# Patient Record
Sex: Male | Born: 1980
Health system: Southern US, Community
[De-identification: ages and names within clinical notes are randomized; demographics above are authoritative.]

## PROBLEM LIST (undated history)

## (undated) DIAGNOSIS — I10 Essential (primary) hypertension: Secondary | ICD-10-CM

## (undated) HISTORY — PX: DENTAL SURGERY: SHX609

---

## 2011-02-27 ENCOUNTER — Emergency Department: Payer: Self-pay | Admitting: Emergency Medicine

## 2012-10-11 ENCOUNTER — Emergency Department: Payer: Self-pay | Admitting: Emergency Medicine

## 2013-05-23 ENCOUNTER — Ambulatory Visit: Payer: Self-pay

## 2014-05-05 ENCOUNTER — Ambulatory Visit: Payer: Self-pay | Admitting: Internal Medicine

## 2014-07-14 ENCOUNTER — Telehealth: Payer: Self-pay | Admitting: Family

## 2014-07-14 NOTE — Telephone Encounter (Signed)
Had mail return for patient - new patient packet.  Please confirm address and remail if patient would like.

## 2014-07-18 ENCOUNTER — Ambulatory Visit (INDEPENDENT_AMBULATORY_CARE_PROVIDER_SITE_OTHER): Payer: Commercial Managed Care - PPO | Admitting: Family

## 2014-07-18 ENCOUNTER — Encounter: Payer: Self-pay | Admitting: Family

## 2014-07-18 DIAGNOSIS — J302 Other seasonal allergic rhinitis: Secondary | ICD-10-CM

## 2014-07-18 DIAGNOSIS — R21 Rash and other nonspecific skin eruption: Secondary | ICD-10-CM | POA: Diagnosis not present

## 2014-07-18 DIAGNOSIS — R5383 Other fatigue: Secondary | ICD-10-CM | POA: Diagnosis not present

## 2014-07-18 MED ORDER — SULFAMETHOXAZOLE-TRIMETHOPRIM 800-160 MG PO TABS: 1.0000 | ORAL_TABLET | Freq: Two times a day (BID) | ORAL | Status: DC

## 2014-07-18 MED ORDER — METHYLPREDNISOLONE ACETATE 80 MG/ML IJ SUSP: 80.0000 mg | Freq: Once | INTRAMUSCULAR | Status: DC

## 2014-07-18 NOTE — Patient Instructions (Addendum)
Thank you for choosing [facility].  Summary/Instructions:  Your prescription(s) have been submitted to your pharmacy or been printed and provided for you. Please take as directed and contact our office if you believe you are having problem(s) with the medication(s) or have any questions.  Please stop by the lab on the basement level of the building for your blood work. Your results will be released to MyChart (or called to you) after review, usually within 72 hours after test completion. If any changes need to be made, you will be notified at that same time.  If your symptoms worsen or fail to improve, please contact our office for further instruction, or in case of emergency go directly to the emergency room at the closest medical facility.     

## 2014-07-18 NOTE — Progress Notes (Signed)
   Subjective:    Patient ID: Dylan Schultz, male    DOB: 04/30/1980, 34 y.o.   MRN: [id number]  Chief Complaint  Patient presents with  . Establish Care    has a cyst on his face that has been there for 6 months that he states is getting bigger, beside right eye    HPI:  Dylan Schultz is a 34 y.o. male who presents today for an office visit to establish care.  1.) Cyst: This is a new problem. Associated symptom of a cyst located on his face beside his right eye has been there for about 6 months. Notes that it has gradually increased in size over that time. Occasional pain associated with it. Pain is described as throbbing with a severity of 3-4/10. Denies any treatments or modifying factors that make it better or worse.   2) Fatigue:  Associated symptom of generalized fatigue has been going for a while now. May snore at night. Denies any daytime sleepiness. Denies changes in libido.  No Known Allergies   No current outpatient prescriptions on file prior to visit.   No current facility-administered medications on file prior to visit.    Review of Systems  Constitutional: Positive for fatigue. Negative for fever and chills.  Skin: Positive for rash.  Neurological: Negative for dizziness and weakness.      Objective:    BP 148/98 mmHg  Pulse 82  Temp(Src) 98.1 F (36.7 C) (Oral)  Resp 18  Ht 5\' 8"  (1.727 m)  Wt 252 lb (114.306 kg)  BMI 38.33 kg/m2  SpO2 99% Nursing note and vital signs reviewed.  Physical Exam  Constitutional: He is oriented to person, place, and time. He appears well-developed and well-nourished. No distress.  Cardiovascular: Normal rate, regular rhythm, normal heart sounds and intact distal pulses.   Pulmonary/Chest: Effort normal and breath sounds normal.  Neurological: He is alert and oriented to person, place, and time.  Skin: Skin is warm and dry.  Approximately 1 inch diameter cysts located lateral to the right eye. Cyst is mobile, and  soft. No evidence of discharge.  Psychiatric: He has a normal mood and affect. His behavior is normal. Judgment and thought content normal.       Assessment & Plan:

## 2014-07-18 NOTE — Assessment & Plan Note (Signed)
Symptoms and exam consistent with potential sebaceous cyst. Start Bactrim to cover for MRSA in attempts to decrease cyst. Referred to dermatology for further management.

## 2014-07-18 NOTE — Assessment & Plan Note (Signed)
Fatigue of undetermined origin. Obtain A1c, CBC, B12, HIV and testosterone to rule out metabolic causes. Cannot rule out anxiety/depression or cardiovascular disease. Obtain blood work between 8 and 10 AM for  Testosterone. Follow up pending blood work results.

## 2014-07-18 NOTE — Progress Notes (Signed)
Pre visit review using our clinic review tool, if applicable. No additional management support is needed unless otherwise documented below in the visit note. 

## 2014-07-22 ENCOUNTER — Other Ambulatory Visit (INDEPENDENT_AMBULATORY_CARE_PROVIDER_SITE_OTHER): Payer: Commercial Managed Care - PPO

## 2014-07-22 DIAGNOSIS — R5383 Other fatigue: Secondary | ICD-10-CM | POA: Diagnosis not present

## 2014-07-22 LAB — CBC
HCT: 38.2 % — ABNORMAL LOW (ref 39.0–52.0)
Hemoglobin: 12.8 g/dL — ABNORMAL LOW (ref 13.0–17.0)
MCHC: 33.5 g/dL (ref 30.0–36.0)
MCV: 80.4 fl (ref 78.0–100.0)
Platelets: 240 10*3/uL (ref 150.0–400.0)
RBC: 4.75 Mil/uL (ref 4.22–5.81)
RDW: 14.5 % (ref 11.5–15.5)
WBC: 5.7 10*3/uL (ref 4.0–10.5)

## 2014-07-22 LAB — HEMOGLOBIN A1C: HEMOGLOBIN A1C: 5.6 % (ref 4.6–6.5)

## 2014-07-22 LAB — VITAMIN B12: VITAMIN B 12: 357 pg/mL (ref 211–911)

## 2014-07-22 LAB — TESTOSTERONE: Testosterone: 502.01 ng/dL (ref 300.00–890.00)

## 2014-07-23 LAB — HIV ANTIBODY (ROUTINE TESTING W REFLEX): HIV 1&2 Ab, 4th Generation: NONREACTIVE

## 2014-07-24 ENCOUNTER — Telehealth: Payer: Self-pay | Admitting: Family

## 2014-07-24 DIAGNOSIS — R5383 Other fatigue: Secondary | ICD-10-CM

## 2014-07-24 NOTE — Telephone Encounter (Signed)
Pt aware of results 

## 2014-07-24 NOTE — Telephone Encounter (Signed)
Please inform patient that his testosterone and B12 were both within the normal ranges. His hemoglobin A1c was also normal at 5.6. His red blood cells were slightly small and his hemoglobin was slightly lowered. There is potential that this is related to iron deficiency. Therefore I have placed additional lab work for him to complete to evaluate his iron levels. No fasting is needed for this and can be done at any time. In the meantime he can start an iron supplement called ferrous sulfate once daily. The dosage is 325 mg.  Also his HIV was negative.

## 2014-07-25 ENCOUNTER — Telehealth: Payer: Self-pay | Admitting: Family

## 2014-07-25 MED ORDER — HYDROCHLOROTHIAZIDE 12.5 MG PO CAPS: 12.5000 mg | ORAL_CAPSULE | Freq: Every day | ORAL | Status: DC

## 2014-07-25 NOTE — Telephone Encounter (Signed)
Pt states he thinks Tammy SoursGreg was going to prescribe bp medication, but nothing was sent to his pharmacy. [facility] [place] [place] CB# [phone]

## 2014-07-25 NOTE — Telephone Encounter (Signed)
Medication sent.

## 2014-07-25 NOTE — Addendum Note (Signed)
Addended by: Jeanine LuzALONE, GREGORY D on: 07/25/2014 04:49 PM   Modules accepted: Orders

## 2015-04-22 ENCOUNTER — Encounter (HOSPITAL_COMMUNITY): Payer: Self-pay | Admitting: Emergency Medicine

## 2015-04-22 ENCOUNTER — Emergency Department (HOSPITAL_COMMUNITY)
Admission: EM | Admit: 2015-04-22 | Discharge: 2015-04-22 | Disposition: A | Discharge status: Home / Self Care | Payer: Commercial Managed Care - PPO | Attending: Emergency Medicine | Admitting: Emergency Medicine

## 2015-04-22 DIAGNOSIS — R519 Headache, unspecified: Secondary | ICD-10-CM

## 2015-04-22 DIAGNOSIS — R51 Headache: Secondary | ICD-10-CM | POA: Diagnosis present

## 2015-04-22 DIAGNOSIS — H53149 Visual discomfort, unspecified: Secondary | ICD-10-CM | POA: Diagnosis not present

## 2015-04-22 DIAGNOSIS — Z792 Long term (current) use of antibiotics: Secondary | ICD-10-CM | POA: Insufficient documentation

## 2015-04-22 DIAGNOSIS — F17210 Nicotine dependence, cigarettes, uncomplicated: Secondary | ICD-10-CM | POA: Diagnosis not present

## 2015-04-22 DIAGNOSIS — Z79899 Other long term (current) drug therapy: Secondary | ICD-10-CM | POA: Insufficient documentation

## 2015-04-22 MED ORDER — KETOROLAC TROMETHAMINE 30 MG/ML IJ SOLN
30.0000 mg | Freq: Once | INTRAMUSCULAR | Status: AC
  Administered 2015-04-22: 30 mg via INTRAVENOUS
  Filled 2015-04-22: qty 1

## 2015-04-22 MED ORDER — PROCHLORPERAZINE EDISYLATE 5 MG/ML IJ SOLN
10.0000 mg | Freq: Once | INTRAMUSCULAR | Status: AC
  Administered 2015-04-22: 10 mg via INTRAVENOUS
  Filled 2015-04-22: qty 2

## 2015-04-22 MED ORDER — SODIUM CHLORIDE 0.9 % IV BOLUS (SEPSIS)
1000.0000 mL | Freq: Once | INTRAVENOUS | Status: AC
  Administered 2015-04-22: 1000 mL via INTRAVENOUS

## 2015-04-22 NOTE — ED Provider Notes (Signed)
CSN: [id number]     Arrival date & time 04/22/15  0908 History   First MD Initiated Contact with Patient 04/22/15 [phone]     Chief Complaint  Patient presents with  . Headache     (Consider location/radiation/quality/duration/timing/severity/associated sxs/prior Treatment) Patient is a 35 y.o. male presenting with headaches. The history is provided by the patient and medical records.  Headache Associated symptoms: photophobia     35 year old male with no significant past medical history presenting to the ED for headache. Patient states he has had a constant headache since Friday. States headache is mostly localized to the left side of his forehead with some associated photophobia. He denies any blurred vision, dizziness, confusion, changes in speech, numbness, or weakness of his extremities. He states he's been taking Tylenol without relief. On Sunday he did try taking 2 Excedrin migraine which relieved his headache for approximately 1 hour but it returned shortly after. Patient has no known history of migraine headaches. States he does have family members suffer from migraines. He is not currently on any type of anticoagulation. No fever, chills, neck pain, or neck stiffness. Patient is hypertensive on arrival.  No hx of HTN.    History reviewed. No pertinent past medical history. Past Surgical History  Procedure Laterality Date  . Dental surgery     Family History  Problem Relation Age of Onset  . Thyroid disease Mother   . Hypertension Father    Social History  Substance Use Topics  . Smoking status: Current Every Day Smoker -- 0.50 packs/day for 8 years    Types: Cigarettes  . Smokeless tobacco: Never Used  . Alcohol Use: 0.0 oz/week    0 Standard drinks or equivalent per week     Comment: occ    Review of Systems  Eyes: Positive for photophobia.  Neurological: Positive for headaches.  All other systems reviewed and are negative.     Allergies  Review of patient's  allergies indicates no known allergies.  Home Medications   Prior to Admission medications   Medication Sig Start Date End Date Taking? Authorizing Provider  hydrochlorothiazide (MICROZIDE) 12.5 MG capsule Take 1 capsule (12.5 mg total) by mouth daily. 07/25/14   Veryl Speak, FNP  sulfamethoxazole-trimethoprim (BACTRIM DS,SEPTRA DS) 800-160 MG per tablet Take 1 tablet by mouth 2 (two) times daily. 07/18/14   Veryl Speak, FNP   BP 138/99 mmHg  Pulse 72  Temp(Src) 98.1 F (36.7 C) (Oral)  Resp 17  Ht  (1.753 m)  Wt 108.863 kg  BMI 35.43 kg/m2  SpO2 99%   Physical Exam  Constitutional: He is oriented to person, place, and time. He appears well-developed and well-nourished. No distress.  NAD, playing games on cell phone  HENT:  Head: Normocephalic and atraumatic.  Mouth/Throat: Oropharynx is clear and moist.  Eyes: Conjunctivae and EOM are normal. Pupils are equal, round, and reactive to light.  Neck: Normal range of motion and full passive range of motion without pain. Neck supple. No spinous process tenderness and no muscular tenderness present. No rigidity.  Full ROM, no meningismus  Cardiovascular: Normal rate, regular rhythm and normal heart sounds.   Pulmonary/Chest: Effort normal and breath sounds normal. No respiratory distress. He has no wheezes.  Abdominal: Soft. Bowel sounds are normal. There is no tenderness. There is no guarding.  Musculoskeletal: Normal range of motion.  Neurological: He is alert and oriented to person, place, and time.  AAOx3, answering questions appropriately; equal strength UE and  LE bilaterally; CN grossly intact; moves all extremities appropriately without ataxia; no focal neuro deficits or facial asymmetry appreciated  Skin: Skin is warm and dry. He is not diaphoretic.  Psychiatric: He has a normal mood and affect.  Nursing note and vitals reviewed.   ED Course  Procedures (including critical care time) Labs Review Labs Reviewed -  No data to display  Imaging Review No results found. I have personally reviewed and evaluated these images and lab results as part of my medical decision-making.   EKG Interpretation None      MDM   Final diagnoses:  Headache, unspecified headache type   35 y.o. M here with persistent headache x 3 days.  Did have brief relief with excedrin migraine however headache returned.  Patient is afebrile, nontoxic in appearance. No focal neurologic deficits. No clinical manifestations concerning for meningitis. Patient was treated in the ED with Toradol and Compazine with complete resolution of his headache. He states he is feeling much better. Remains neurologically intact. Vital signs stable. Patient is appropriate for discharge. Patient to follow-up with his PCP. Of note, patient's blood pressure was somewhat elevated here in the ED.  He has no documented hx of HTN. This may be related to his headache, however encouraged him to have this rechecked by PCP.  Discussed plan with patient, he/she acknowledged understanding and agreed with plan of care.  Return precautions given for new or worsening symptoms.  Garlon Hatchet, PA-C 04/22/15 1217  Bethann Berkshire, MD 04/24/15 [phone]

## 2015-04-22 NOTE — Discharge Instructions (Signed)
May continue tylenol, motrin, excedrin migraine as needed for recurrent headache. Follow-up with your primary care physician. Return here for new concerns.

## 2015-04-22 NOTE — ED Notes (Signed)
Pt reports persistent headache and light sensitivity since Friday. Pt denies blurred vision or dizziness. Denies n/v. Denies weakness or numbness. Pt taking OTC medication with no relief. Pt hypertensive 146/104.

## 2015-04-22 NOTE — ED Notes (Signed)
Pt c/o constant headache since Friday.  Denies any weakness or numbness in any extremities.

## 2015-04-28 ENCOUNTER — Encounter: Payer: Self-pay | Admitting: Family

## 2015-04-28 ENCOUNTER — Telehealth: Payer: Self-pay | Admitting: Family

## 2015-04-28 ENCOUNTER — Ambulatory Visit (INDEPENDENT_AMBULATORY_CARE_PROVIDER_SITE_OTHER): Payer: Commercial Managed Care - PPO | Admitting: Family

## 2015-04-28 ENCOUNTER — Other Ambulatory Visit (INDEPENDENT_AMBULATORY_CARE_PROVIDER_SITE_OTHER): Payer: Commercial Managed Care - PPO

## 2015-04-28 DIAGNOSIS — R21 Rash and other nonspecific skin eruption: Secondary | ICD-10-CM

## 2015-04-28 DIAGNOSIS — R5383 Other fatigue: Secondary | ICD-10-CM | POA: Diagnosis not present

## 2015-04-28 DIAGNOSIS — I10 Essential (primary) hypertension: Secondary | ICD-10-CM | POA: Insufficient documentation

## 2015-04-28 DIAGNOSIS — R51 Headache: Secondary | ICD-10-CM | POA: Diagnosis not present

## 2015-04-28 DIAGNOSIS — Z23 Encounter for immunization: Secondary | ICD-10-CM | POA: Diagnosis not present

## 2015-04-28 DIAGNOSIS — R519 Headache, unspecified: Secondary | ICD-10-CM | POA: Insufficient documentation

## 2015-04-28 LAB — BASIC METABOLIC PANEL
BUN: 14 mg/dL (ref 6–23)
CHLORIDE: 104 meq/L (ref 96–112)
CO2: 30 meq/L (ref 19–32)
CREATININE: 1.26 mg/dL (ref 0.40–1.50)
Calcium: 9.8 mg/dL (ref 8.4–10.5)
GFR: 83.55 mL/min (ref >60.00)
GLUCOSE: 94 mg/dL (ref 70–99)
Potassium: 3.8 mEq/L (ref 3.5–5.1)
Sodium: 138 mEq/L (ref 135–145)

## 2015-04-28 LAB — IBC PANEL
Iron: 68 ug/dL (ref 42–165)
Saturation Ratios: 20.4 % (ref 20.0–50.0)
Transferrin: 238 mg/dL (ref 212.0–360.0)

## 2015-04-28 MED ORDER — TRIAMCINOLONE ACETONIDE 0.025 % EX OINT: 1.0000 "application " | TOPICAL_OINTMENT | Freq: Two times a day (BID) | CUTANEOUS | Status: AC

## 2015-04-28 MED ORDER — NAPROXEN-ESOMEPRAZOLE 500-20 MG PO TBEC: 1.0000 | DELAYED_RELEASE_TABLET | Freq: Two times a day (BID) | ORAL | Status: AC | PRN

## 2015-04-28 MED ORDER — LISINOPRIL-HYDROCHLOROTHIAZIDE 20-12.5 MG PO TABS: 1.0000 | ORAL_TABLET | Freq: Every day | ORAL | Status: AC

## 2015-04-28 NOTE — Progress Notes (Signed)
Pre visit review using our clinic review tool, if applicable. No additional management support is needed unless otherwise documented below in the visit note. 

## 2015-04-28 NOTE — Progress Notes (Signed)
Subjective:    Patient ID: Dylan Schultz, male    DOB: 1980-10-15, 35 y.o.   MRN: [id number]  Chief Complaint  Patient presents with  . Follow-up    ED follow up? headaches    HPI:  Dylan Schultz is a 35 y.o. male who  has no past medical history on file. and presents today for a follow up office visit.   Recently evaluated in the emergency room for a headache that has been going on for a couple days and located on the left side of his forehead with some photophobia. Modifying factors included Excedrin Migraine which relieved his headache for about 1 hour and returned shortly after. No history of migraine headaches was appreciated. He was noted to be hypertensive on arrival. Physical exam with no neurologic deficits. Treated with Toradol and Compazine with complete resolution of his headache. Instructed to follow-up with primary care. Concern for possible relation of hypertension to his headache with request for primary care to check on blood pressure. All ED records were reviewed in detail.   After leaving the ED symptoms of a headache returned about 2 hours later and is currently present. Pain is located in the front of his head with a severity that is described as a constant little nagging pain. Pain is described as sharp. No sensitivity to light or sound with no nausea and vomiting. No neck pain. Denies worst headache of his life. No changes in vision.  2.) Rash - Associated symptom of a rash located on his right arm has been going on for several days and described as itchy and red. Denies worsening. No modifying factors or treatments in attempt to make it better or worse.   No Known Allergies   No current outpatient prescriptions on file prior to visit.   No current facility-administered medications on file prior to visit.     Review of Systems  Constitutional: Negative for fever and chills.  HENT: Negative for congestion and ear pain.   Respiratory: Negative for cough,  chest tightness and wheezing.   Cardiovascular: Negative for chest pain, palpitations and leg swelling.  Neurological: Positive for headaches.      Objective:    BP 148/92 mmHg  Pulse 92  Temp(Src) 98.1 F (36.7 C) (Oral)  Resp 18  Ht  (1.727 m)  Wt 255 lb (115.667 kg)  BMI 38.78 kg/m2  SpO2 98% Nursing note and vital signs reviewed.  Physical Exam  Constitutional: He is oriented to person, place, and time. He appears well-developed and well-nourished. No distress.  HENT:  Right Ear: Hearing, tympanic membrane, external ear and ear canal normal.  Left Ear: Hearing, tympanic membrane, external ear and ear canal normal.  Nose: Nose normal.  Mouth/Throat: Uvula is midline, oropharynx is clear and moist and mucous membranes are normal.  Eyes: Conjunctivae are normal. Pupils are equal, round, and reactive to light.  Cardiovascular: Normal rate, regular rhythm, normal heart sounds and intact distal pulses.   Pulmonary/Chest: Effort normal and breath sounds normal.  Neurological: He is alert and oriented to person, place, and time. No cranial nerve deficit.  Skin: Skin is warm and dry.  2 mm reddened and dry appearing area located in the right antecubital fossa.  Psychiatric: He has a normal mood and affect. His behavior is normal. Judgment and thought content normal.       Assessment & Plan:   Problem List Items Addressed This Visit      Cardiovascular and Mediastinum  Essential hypertension    Multiple blood pressure readings above 140/90 which qualifies as hypertension. Discussed risks for end organ damage and elevated blood pressure. Start hydrochlorothiazide-lisinopril. Encouraged to monitor blood pressure at home. Blood pressure may be the cause of his headaches. Follow-up in one month or sooner if symptoms worsen or blood pressure remains elevated.      Relevant Medications   lisinopril-hydrochlorothiazide (ZESTORETIC) 20-12.5 MG tablet   Other Relevant Orders    Basic Metabolic Panel (BMET)     Musculoskeletal and Integument   Rash    2 mm area consistent with eczematous inflammation. Start triamcinolone. Follow-up if symptoms worsen or fail to improve.        Other   Mixed headache    Symptoms consistent with mixed headaches. Neurological exam is benign. Encouraged to complete eye exam to rule out ocular source of symptoms. Headaches could be the result of blood pressure. Obtain basic metabolic panel to check electrolytes and glucose. Start naproxen-esomeprazole as needed for headaches. Denies worse headache of life. Follow-up if headaches worsen or do not improve with medication.      Relevant Medications   Naproxen-Esomeprazole (VIMOVO) 500-20 MG TBEC   Other Relevant Orders   Basic Metabolic Panel (BMET)    Other Visit Diagnoses    Need for diphtheria-tetanus-pertussis (Tdap) vaccine, adult/adolescent        Relevant Orders    Tdap vaccine greater than or equal to 7yo IM (Completed)    Encounter for immunization

## 2015-04-28 NOTE — Patient Instructions (Addendum)
Thank you for choosing [facility].  Summary/Instructions:  Please start taking the medication as prescribed.   Your prescription(s) have been submitted to your pharmacy or been printed and provided for you. Please take as directed and contact our office if you believe you are having problem(s) with the medication(s) or have any questions.  Please stop by the lab on the basement level of the building for your blood work. Your results will be released to MyChart (or called to you) after review, usually within 72 hours after test completion. If any changes need to be made, you will be notified at that same time.  If your symptoms worsen or fail to improve, please contact our office for further instruction, or in case of emergency go directly to the emergency room at the closest medical facility.   Hypertension Hypertension, commonly called high blood pressure, is when the force of blood pumping through your arteries is too strong. Your arteries are the blood vessels that carry blood from your heart throughout your body. A blood pressure reading consists of a higher number over a lower number, such as 110/72. The higher number (systolic) is the pressure inside your arteries when your heart pumps. The lower number (diastolic) is the pressure inside your arteries when your heart relaxes. Ideally you want your blood pressure below 120/80. Hypertension forces your heart to work harder to pump blood. Your arteries may become narrow or stiff. Having untreated or uncontrolled hypertension can cause heart attack, stroke, kidney disease, and other problems. RISK FACTORS Some risk factors for high blood pressure are controllable. Others are not.  Risk factors you cannot control include:   Race. You may be at higher risk if you are African American.  Age. Risk increases with age.  Gender. Men are at higher risk than women before age 54 years. After age 8, women are at higher risk than men. Risk  factors you can control include:  Not getting enough exercise or physical activity.  Being overweight.  Getting too much fat, sugar, calories, or salt in your diet.  Drinking too much alcohol. SIGNS AND SYMPTOMS Hypertension does not usually cause signs or symptoms. Extremely high blood pressure (hypertensive crisis) may cause headache, anxiety, shortness of breath, and nosebleed. DIAGNOSIS To check if you have hypertension, your health care provider will measure your blood pressure while you are seated, with your arm held at the level of your heart. It should be measured at least twice using the same arm. Certain conditions can cause a difference in blood pressure between your right and left arms. A blood pressure reading that is higher than normal on one occasion does not mean that you need treatment. If it is not clear whether you have high blood pressure, you may be asked to return on a different day to have your blood pressure checked again. Or, you may be asked to monitor your blood pressure at home for 1 or more weeks. TREATMENT Treating high blood pressure includes making lifestyle changes and possibly taking medicine. Living a healthy lifestyle can help lower high blood pressure. You may need to change some of your habits. Lifestyle changes may include:  Following the DASH diet. This diet is high in fruits, vegetables, and whole grains. It is low in salt, red meat, and added sugars.  Keep your sodium intake below 2,300 mg per day.  Getting at least 30-45 minutes of aerobic exercise at least 4 times per week.  Losing weight if necessary.  Not smoking.  Limiting alcoholic beverages.  Learning ways to reduce stress. Your health care provider may prescribe medicine if lifestyle changes are not enough to get your blood pressure under control, and if one of the following is true:  You are 54-80 years of age and your systolic blood pressure is above 140.  You are 41 years of age  or older, and your systolic blood pressure is above 150.  Your diastolic blood pressure is above 90.  You have diabetes, and your systolic blood pressure is over 140 or your diastolic blood pressure is over 90.  You have kidney disease and your blood pressure is above 140/90.  You have heart disease and your blood pressure is above 140/90. Your personal target blood pressure may vary depending on your medical conditions, your age, and other factors. HOME CARE INSTRUCTIONS  Have your blood pressure rechecked as directed by your health care provider.   Take medicines only as directed by your health care provider. Follow the directions carefully. Blood pressure medicines must be taken as prescribed. The medicine does not work as well when you skip doses. Skipping doses also puts you at risk for problems.  Do not smoke.   Monitor your blood pressure at home as directed by your health care provider. SEEK MEDICAL CARE IF:   You think you are having a reaction to medicines taken.  You have recurrent headaches or feel dizzy.  You have swelling in your ankles.  You have trouble with your vision. SEEK IMMEDIATE MEDICAL CARE IF:  You develop a severe headache or confusion.  You have unusual weakness, numbness, or feel faint.  You have severe chest or abdominal pain.  You vomit repeatedly.  You have trouble breathing. MAKE SURE YOU:   Understand these instructions.  Will watch your condition.  Will get help right away if you are not doing well or get worse.   This information is not intended to replace advice given to you by your health care provider. Make sure you discuss any questions you have with your health care provider.   Document Released: 03/07/2005 Document Revised: 07/22/2014 Document Reviewed: 12/28/2012 Elsevier Interactive Patient Education [employer]

## 2015-04-28 NOTE — Assessment & Plan Note (Signed)
Symptoms consistent with mixed headaches. Neurological exam is benign. Encouraged to complete eye exam to rule out ocular source of symptoms. Headaches could be the result of blood pressure. Obtain basic metabolic panel to check electrolytes and glucose. Start naproxen-esomeprazole as needed for headaches. Denies worse headache of life. Follow-up if headaches worsen or do not improve with medication.

## 2015-04-28 NOTE — Telephone Encounter (Signed)
Please inform patient that his kidney function and iron levels are both within the normal limits.

## 2015-04-28 NOTE — Assessment & Plan Note (Signed)
Multiple blood pressure readings above 140/90 which qualifies as hypertension. Discussed risks for end organ damage and elevated blood pressure. Start hydrochlorothiazide-lisinopril. Encouraged to monitor blood pressure at home. Blood pressure may be the cause of his headaches. Follow-up in one month or sooner if symptoms worsen or blood pressure remains elevated.

## 2015-04-28 NOTE — Assessment & Plan Note (Signed)
2 mm area consistent with eczematous inflammation. Start triamcinolone. Follow-up if symptoms worsen or fail to improve.

## 2015-05-01 NOTE — Telephone Encounter (Signed)
Left message letting pt know results.

## 2017-09-04 ENCOUNTER — Encounter: Payer: Self-pay | Admitting: Emergency Medicine

## 2017-09-04 ENCOUNTER — Emergency Department
Admission: EM | Admit: 2017-09-04 | Discharge: 2017-09-04 | Disposition: A | Discharge status: Home / Self Care | Payer: Commercial Managed Care - PPO | Attending: Emergency Medicine | Admitting: Emergency Medicine

## 2017-09-04 DIAGNOSIS — I10 Essential (primary) hypertension: Secondary | ICD-10-CM | POA: Insufficient documentation

## 2017-09-04 DIAGNOSIS — F17210 Nicotine dependence, cigarettes, uncomplicated: Secondary | ICD-10-CM | POA: Insufficient documentation

## 2017-09-04 DIAGNOSIS — L0201 Cutaneous abscess of face: Secondary | ICD-10-CM | POA: Insufficient documentation

## 2017-09-04 DIAGNOSIS — Z79899 Other long term (current) drug therapy: Secondary | ICD-10-CM | POA: Insufficient documentation

## 2017-09-04 MED ORDER — HYDROCODONE-ACETAMINOPHEN 5-325 MG PO TABS: 1.0000 | ORAL_TABLET | ORAL | 0 refills | Status: DC | PRN

## 2017-09-04 MED ORDER — LIDOCAINE HCL (PF) 1 % IJ SOLN
5.0000 mL | Freq: Once | INTRAMUSCULAR | Status: AC
  Administered 2017-09-04: 5 mL
  Filled 2017-09-04: qty 5

## 2017-09-04 MED ORDER — SULFAMETHOXAZOLE-TRIMETHOPRIM 800-160 MG PO TABS: 1.0000 | ORAL_TABLET | Freq: Two times a day (BID) | ORAL | 0 refills | Status: AC

## 2017-09-04 NOTE — ED Notes (Signed)
See triage note  Presents with a possible abscess area to right side of face  States area started about 2 weeks ago..Marland Kitchen

## 2017-09-04 NOTE — ED Triage Notes (Signed)
Patient presents to the ED with a swollen area to the right side of his face.  Patient states he noticed the area about 1 month ago but area began to swell and become painful approx. 2-3 days.  Patient states, "It feels really tight".  Circular area noted approx. Quarter size.  Patient denies any fever or other complaints.

## 2017-09-04 NOTE — Discharge Instructions (Addendum)
Follow-up with your primary care provider return to the emergency department 2 days for removal of your packing.  If it falls out on its own you do not need to return.  Begin taking Bactrim DS twice daily for 10 days.  Norco 1 every 6 hours as needed for pain.  Do not drive or operate machinery while taking this medication as it could cause drowsiness and increase your risk for injury.

## 2017-09-04 NOTE — ED Provider Notes (Signed)
[facility] Emergency Department Provider Note  ____________________________________________   First MD Initiated Contact with Patient 09/04/17 1221     (approximate)  I have reviewed the triage vital signs and the nursing notes.   HISTORY  Chief Complaint Facial Pain   HPI Dylan Schultz is a 37 y.o. male is here with complaint of a small bump that is on the right side of his face.  He states that he noticed it approximately 1 month ago and over the last 2 to 3 days has become painful and swollen.  Patient complains of a tightness feeling.  He denies any fever or chills.  He denies any previous abscesses.  He rates his pain as a 9 out of 10.   History reviewed. No pertinent past medical history.  Patient Active Problem List   Diagnosis Date Noted  . Essential hypertension 04/28/2015  . 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 Fatigue 07/18/2014  . Rash 07/18/2014    Past Surgical History:  Procedure Laterality Date  . DENTAL SURGERY      Prior to Admission medications   Medication Sig Start Date End Date Taking? Authorizing Provider  HYDROcodone-acetaminophen (NORCO/VICODIN) 5-325 MG tablet Take 1 tablet by mouth every 4 (four) hours as needed for moderate pain. 09/04/17   Tommi RumpsSummers, Rhonda L, PA-C  lisinopril-hydrochlorothiazide (ZESTORETIC) 20-12.5 MG tablet Take 1 tablet by mouth daily. 04/28/15   Veryl Speakalone, Gregory D, FNP  Naproxen-Esomeprazole (VIMOVO) 500-20 MG TBEC Take 1 tablet by mouth 2 (two) times daily as needed. 04/28/15   Veryl Speakalone, Gregory D, FNP  sulfamethoxazole-trimethoprim (BACTRIM DS,SEPTRA DS) 800-160 MG tablet Take 1 tablet by mouth 2 (two) times daily. 09/04/17   Tommi RumpsSummers, Rhonda L, PA-C  triamcinolone (KENALOG) 0.025 % ointment Apply 1 application topically 2 (two) times daily. 04/28/15   Veryl Speakalone, Gregory D, FNP    Allergies Patient has no known allergies.  Family History  Problem Relation Age of Onset  . Thyroid disease Mother   . Hypertension  Father     Social History Social History   Tobacco Use  . Smoking status: Current Every Day Smoker    Packs/day: 0.50    Years: 8.00    Pack years: 4.00    Types: Cigarettes  . Smokeless tobacco: Never Used  Substance Use Topics  . Alcohol use: Yes    Alcohol/week: 0.0 oz    Comment: occ  . Drug use: No    Review of Systems Constitutional: No fever/chills Eyes: No visual changes. Cardiovascular: Denies chest pain. Respiratory: Denies shortness of breath. Gastrointestinal:  No nausea, no vomiting.  Musculoskeletal: Negative for muscle aches. Skin: Positive for abscess. Neurological: Negative for headaches ____________________________________________   PHYSICAL EXAM:  VITAL SIGNS: ED Triage Vitals  Enc Vitals Group     BP 09/04/17 1216 (!) 143/96     Pulse Rate 09/04/17 1216 71     Resp 09/04/17 1216 16     Temp 09/04/17 1216 98.8 F (37.1 C)     Temp Source 09/04/17 1216 Oral     SpO2 09/04/17 1216 100 %     Weight 09/04/17 1217 245 lb (111.1 kg)     Height 09/04/17 1217 5\' 9"  (1.753 m)     Head Circumference --      Peak Flow --      Pain Score 09/04/17 1217 9     Pain Loc --      Pain Edu? --      Excl. in GC? --  Constitutional: Alert and oriented. Well appearing and in no acute distress. Eyes: Conjunctivae are normal.  Head: Atraumatic. Nose: No congestion/rhinnorhea. Neck: No stridor.   Cardiovascular: Normal rate, regular rhythm. Grossly normal heart sounds.  Good peripheral circulation. Respiratory: Normal respiratory effort.  No retractions. Lungs CTAB. Musculoskeletal: Moves upper and lower extremities without any difficulty. Neurologic:  Normal speech and language. No gross focal neurologic deficits are appreciated. No gait instability. Skin:  Skin is warm, dry and intact.  There is a 2 cm lesion to the right cheek that is erythematous, tender and fluctuant.  No drainage is noted. Psychiatric: Mood and affect are normal. Speech and behavior  are normal.  ____________________________________________   LABS (all labs ordered are listed, but only abnormal results are displayed)  Labs Reviewed - No data to display  PROCEDURES  Procedure(s) performed:  Marland KitchenMarland KitchenIncision and Drainage Date/Time: 09/04/2017 12:46 PM Performed by: Tommi Rumps, PA-C Authorized by: Tommi Rumps, PA-C   Consent:    Consent obtained:  Verbal   Consent given by:  Patient   Risks discussed:  Pain and infection Location:    Type:  Abscess   Location:  Head   Head location:  Face Pre-procedure details:    Skin preparation:  Antiseptic wash Anesthesia (see MAR for exact dosages):    Anesthesia method:  Local infiltration   Local anesthetic:  Lidocaine 1% w/o epi Procedure type:    Complexity:  Simple Procedure details:    Needle aspiration: no     Incision types:  Stab incision and single straight   Incision depth:  Dermal   Scalpel blade:  11   Wound management:  Probed and deloculated   Drainage:  Purulent   Drainage amount:  Scant   Wound treatment:  Drain placed   Packing materials:  1/4 in iodoform gauze Post-procedure details:    Patient tolerance of procedure:  Tolerated well, no immediate complications    Critical Care performed: No  ____________________________________________   INITIAL IMPRESSION / ASSESSMENT AND PLAN / ED COURSE  As part of my medical decision making, I reviewed the following data within the electronic MEDICAL RECORD NUMBER Notes from prior ED visits and [place] Controlled Substance Database  Patient is here with an abscess to his right cheek.  He tolerated I&D without any difficulties.  Patient is return in 2 days or see his primary care provider for removal of the drain.  He was given a prescription for Bactrim DS twice daily for 10 days and Norco as needed for pain.   FINAL CLINICAL IMPRESSION(S) / ED DIAGNOSES  Final diagnoses:  Acute abscess of face     ED Discharge Orders        Ordered     sulfamethoxazole-trimethoprim (BACTRIM DS,SEPTRA DS) 800-160 MG tablet  2 times daily     09/04/17 1305    HYDROcodone-acetaminophen (NORCO/VICODIN) 5-325 MG tablet  Every 4 hours PRN     09/04/17 1305       Note:  This document was prepared using Dragon voice recognition software and may include unintentional dictation errors.    Tommi Rumps, PA-C 09/04/17 1556    Schaevitz, Myra Rude, MD 09/04/17 [phone]

## 2017-11-17 ENCOUNTER — Emergency Department (HOSPITAL_COMMUNITY): Payer: Self-pay

## 2017-11-17 ENCOUNTER — Other Ambulatory Visit: Payer: Self-pay

## 2017-11-17 ENCOUNTER — Emergency Department (HOSPITAL_COMMUNITY)
Admission: EM | Admit: 2017-11-17 | Discharge: 2017-11-17 | Disposition: A | Discharge status: Home / Self Care | Payer: Self-pay | Attending: Emergency Medicine | Admitting: Emergency Medicine

## 2017-11-17 ENCOUNTER — Encounter (HOSPITAL_COMMUNITY): Payer: Self-pay | Admitting: Emergency Medicine

## 2017-11-17 DIAGNOSIS — H6001 Abscess of right external ear: Secondary | ICD-10-CM | POA: Insufficient documentation

## 2017-11-17 DIAGNOSIS — I10 Essential (primary) hypertension: Secondary | ICD-10-CM | POA: Insufficient documentation

## 2017-11-17 DIAGNOSIS — F17210 Nicotine dependence, cigarettes, uncomplicated: Secondary | ICD-10-CM | POA: Insufficient documentation

## 2017-11-17 DIAGNOSIS — L03221 Cellulitis of neck: Secondary | ICD-10-CM | POA: Insufficient documentation

## 2017-11-17 LAB — CBG MONITORING, ED: Glucose-Capillary: 114 mg/dL — ABNORMAL HIGH (ref 70–99)

## 2017-11-17 LAB — BUN: BUN: 8 mg/dL (ref 6–20)

## 2017-11-17 LAB — CREATININE, SERUM
CREATININE: 1.16 mg/dL (ref 0.61–1.24)
GFR calc Af Amer: >60 mL/min (ref >60)
GFR calc non Af Amer: >60 mL/min (ref >60)

## 2017-11-17 MED ORDER — CLINDAMYCIN HCL 150 MG PO CAPS: 450.0000 mg | ORAL_CAPSULE | Freq: Three times a day (TID) | ORAL | 0 refills | Status: DC

## 2017-11-17 MED ORDER — LIDOCAINE HCL (PF) 2 % IJ SOLN
10.0000 mL | Freq: Once | INTRAMUSCULAR | Status: AC
  Administered 2017-11-17: 10 mL

## 2017-11-17 MED ORDER — LIDOCAINE HCL (PF) 2 % IJ SOLN
INTRAMUSCULAR | Status: AC
  Filled 2017-11-17: qty 20

## 2017-11-17 MED ORDER — CLINDAMYCIN PHOSPHATE 600 MG/50ML IV SOLN
600.0000 mg | Freq: Once | INTRAVENOUS | Status: AC
  Administered 2017-11-17: 600 mg via INTRAVENOUS
  Filled 2017-11-17: qty 50

## 2017-11-17 MED ORDER — HYDROCODONE-ACETAMINOPHEN 5-325 MG PO TABS: 1.0000 | ORAL_TABLET | Freq: Four times a day (QID) | ORAL | 0 refills | Status: DC | PRN

## 2017-11-17 MED ORDER — POVIDONE-IODINE 10 % EX SOLN
CUTANEOUS | Status: DC | PRN
  Administered 2017-11-17: 2 via TOPICAL
  Filled 2017-11-17: qty 15

## 2017-11-17 MED ORDER — HYDROCODONE-ACETAMINOPHEN 5-325 MG PO TABS: 1.0000 | ORAL_TABLET | Freq: Four times a day (QID) | ORAL | 0 refills | Status: AC | PRN

## 2017-11-17 MED ORDER — CLINDAMYCIN HCL 150 MG PO CAPS: 450.0000 mg | ORAL_CAPSULE | Freq: Three times a day (TID) | ORAL | 0 refills | Status: AC

## 2017-11-17 MED ORDER — IOHEXOL 300 MG/ML  SOLN
75.0000 mL | Freq: Once | INTRAMUSCULAR | Status: AC | PRN
  Administered 2017-11-17: 75 mL via INTRAVENOUS

## 2017-11-17 NOTE — ED Notes (Signed)
To CT

## 2017-11-17 NOTE — ED Notes (Signed)
Dylan Schultz has seen   Pt currently refusing IV  Awaiting Dr Dylan Schultz to eval

## 2017-11-17 NOTE — ED Notes (Signed)
Call from CT   They cannot eval pt without BUN Cret

## 2017-11-17 NOTE — ED Triage Notes (Signed)
Pt c/o of right ear swelling and pain x 3 days.

## 2017-11-17 NOTE — ED Notes (Signed)
From Rad 

## 2017-11-17 NOTE — Discharge Instructions (Signed)
Take the entire course of the antibiotics prescribed, starting tomorrow morning as you received todays dose with through your IV.  Apply warm water compresses (with epsom salt) as discussed 3 times daily for 5-10 minutes to keep your earlobe draining for the next few days.  You may take the hydrocodone prescribed for pain relief.  This will make you drowsy - do not drive within 4 hours of taking this medication.

## 2017-11-17 NOTE — ED Notes (Signed)
Hx of facial abscess on the same side  3 days hx of swelling to R earlobe with pain extending into his R neck Has taken tylenol without reduction of swelling or pain  Has no PCP and will need referral

## 2017-11-17 NOTE — ED Notes (Signed)
Spec drawn  to lab

## 2017-11-17 NOTE — ED Notes (Signed)
Pump from ICU- meds hung

## 2017-11-17 NOTE — ED Provider Notes (Signed)
[facility] EMERGENCY DEPARTMENT Provider Note   CSN: [id number] Arrival date & time: 11/17/17  1131     History   Chief Complaint Chief Complaint  Patient presents with  . Otalgia    HPI Germain Vanbergen is a 37 y.o. male with a 3 day history of right earlobe swelling, pain and now swelling and redness radiating down his right neck.  He denies fevers, chills, drainage from the site and also denies n/v, weakness or other complaint.  He had a small abscess on his right face that was drained in June, prior to this has had no problems with skin infections or absess.  No family history of similar sx.  He has applied warm compresses without relief of symptoms.   The history is provided by the patient.    History reviewed. No pertinent past medical history.  Patient Active Problem List   Diagnosis Date Noted  . Essential hypertension 04/28/2015  . 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 Fatigue 07/18/2014  . Rash 07/18/2014    Past Surgical History:  Procedure Laterality Date  . DENTAL SURGERY          Home Medications    Prior to Admission medications   Medication Sig Start Date End Date Taking? Authorizing Provider  clindamycin (CLEOCIN) 150 MG capsule Take 3 capsules (450 mg total) by mouth 3 (three) times daily for 7 days. 11/17/17 11/24/17  Burgess Amor, PA-C  HYDROcodone-acetaminophen (NORCO/VICODIN) 5-325 MG tablet Take 1 tablet by mouth every 6 (six) hours as needed. 11/17/17   Burgess Amor, PA-C  lisinopril-hydrochlorothiazide (ZESTORETIC) 20-12.5 MG tablet Take 1 tablet by mouth daily. 04/28/15   Veryl Speak, FNP  Naproxen-Esomeprazole (VIMOVO) 500-20 MG TBEC Take 1 tablet by mouth 2 (two) times daily as needed. 04/28/15   Veryl Speak, FNP  sulfamethoxazole-trimethoprim (BACTRIM DS,SEPTRA DS) 800-160 MG tablet Take 1 tablet by mouth 2 (two) times daily. 09/04/17   Tommi Rumps, PA-C  triamcinolone (KENALOG) 0.025 % ointment Apply 1 application topically 2 (two) times  daily. 04/28/15   Veryl Speak, FNP    Family History Family History  Problem Relation Age of Onset  . Thyroid disease Mother   . Hypertension Father     Social History Social History   Tobacco Use  . Smoking status: Current Every Day Smoker    Packs/day: 0.50    Years: 8.00    Pack years: 4.00    Types: Cigarettes  . Smokeless tobacco: Never Used  Substance Use Topics  . Alcohol use: Yes    Alcohol/week: 0.0 standard drinks    Comment: occ  . Drug use: No     Allergies   Patient has no known allergies.   Review of Systems Review of Systems  Constitutional: Negative for chills and fever.  HENT: Positive for ear pain and facial swelling. Negative for ear discharge.   Respiratory: Negative for shortness of breath.   Cardiovascular: Negative.   Gastrointestinal: Negative.   Skin: Positive for color change.  Neurological: Negative for numbness.  All other systems reviewed and are negative.    Physical Exam Updated Vital Signs BP (!) 173/99 (BP Location: Right Arm)   Pulse 78   Temp 98.6 F (37 C) (Oral)   Resp 16   Ht 5\' 9"  (1.753 m)   Wt 113.4 kg   SpO2 100%   BMI 36.92 kg/m   Physical Exam  Constitutional: He appears well-developed and well-nourished. No distress.  HENT:  Head: Normocephalic.  Neck:  Neck supple.  Cardiovascular: Normal rate.  Pulmonary/Chest: Effort normal. He has no wheezes.  Musculoskeletal: Normal range of motion. He exhibits no edema.  Skin: There is erythema.  Erythema with induration noted at the right anterior earlobe with fluctuance of the posterior lobe, no drainage.  Erythema and soft edema along the mandible angle and extending into the right lateral neck. No trismus, no drainage or red streaking.  Neck is supple.     ED Treatments / Results  Labs (all labs ordered are listed, but only abnormal results are displayed) Labs Reviewed  CBG MONITORING, ED - Abnormal; Notable for the following components:      Result  Value   Glucose-Capillary 114 (*)    All other components within normal limits  BUN  CREATININE, SERUM    EKG None  Radiology Ct Soft Tissue Neck W Contrast  Result Date: 11/17/2017 CLINICAL DATA:  Right ear swelling and pain for 3 days EXAM: CT NECK WITH CONTRAST TECHNIQUE: Multidetector CT imaging of the neck was performed using the standard protocol following the bolus administration of intravenous contrast. CONTRAST:  75mL OMNIPAQUE IOHEXOL 300 MG/ML  SOLN COMPARISON:  None. FINDINGS: Pharynx and larynx: No evidence of mass or swelling Salivary glands: Homogeneous lobulated nodules in the superficial right parotid are likely enlarged nodes in this setting. No primary salivary disease suspected. Thyroid: Negative Lymph nodes: Adenopathy in the right parotid bed as noted above. Vascular: Negative Limited intracranial: Partially empty sella Visualized orbits: Negative Mastoids and visualized paranasal sinuses: Remote blowout fracture of the medial wall right orbit affecting the right ethmoid sinuses. No acute sinusitis. The mastoid and middle ear spaces are clear. Skeleton: Poor dentition with periapical erosions around remaining bilateral maxillary molars. Upper chest: Negative Other: There is low-density thickening of the right ear lobe measuring 14 mm, consistent with abscess rather than keloid or mass given the clinical history. The skin is regionally thickened with subcutaneous stranding. IMPRESSION: 14 mm abscess in the right ear lobe with regional cellulitis and reactive adenitis. Electronically Signed   By: Marnee Spring M.D.   On: 11/17/2017 16:14    Procedures Procedures (including critical care time)  INCISION AND DRAINAGE Performed by: Burgess Amor Consent: Verbal consent obtained. Risks and benefits: risks, benefits and alternatives were discussed Type: abscess  Body area: right earlobe  Anesthesia: local infiltration  Incision was made with a scalpel.  Local anesthetic:  lidocaine lidocaine% without epinephrine  Anesthetic total: 2 ml  Complexity: complex Blunt dissection to break up loculations  Drainage: purulent  Drainage amount: moderate  Packing material: no packing.  Patient tolerance: Patient tolerated the procedure well with no immediate complications.     Medications Ordered in ED Medications  povidone-iodine (BETADINE) 10 % external solution (2 application Topical Given 11/17/17 1549)  clindamycin (CLEOCIN) IVPB 600 mg (600 mg Intravenous New Bag/Given 11/17/17 1734)  lidocaine (XYLOCAINE) 2 % injection 10 mL (10 mLs Other Given 11/17/17 1549)  iohexol (OMNIPAQUE) 300 MG/ML solution 75 mL (75 mLs Intravenous Contrast Given 11/17/17 1528)     Initial Impression / Assessment and Plan / ED Course  I have reviewed the triage vital signs and the nursing notes.  Pertinent labs & imaging results that were available during my care of the patient were reviewed by me and considered in my medical decision making (see chart for details).     Pt given dose of IV clindamycin here, will continue at home.  Warm compresses, hydrocdone prn pain. Strict return precautions discussed.  Imaging reviewed and discussed with pt.   Final Clinical Impressions(s) / ED Diagnoses   Final diagnoses:  Abscess of earlobe, right  Cellulitis of neck    ED Discharge Orders         Ordered    clindamycin (CLEOCIN) 150 MG capsule  3 times daily,   Status:  Discontinued     11/17/17 1728    HYDROcodone-acetaminophen (NORCO/VICODIN) 5-325 MG tablet  Every 6 hours PRN,   Status:  Discontinued     11/17/17 1728    clindamycin (CLEOCIN) 150 MG capsule  3 times daily     11/17/17 1740    HYDROcodone-acetaminophen (NORCO/VICODIN) 5-325 MG tablet  Every 6 hours PRN     11/17/17 1740           Burgess Amordol, Lavada Langsam, PA-C 11/17/17 1751    Bethann BerkshireZammit, Joseph, MD 11/18/17 [phone]

## 2018-10-25 ENCOUNTER — Other Ambulatory Visit: Payer: Self-pay

## 2018-10-25 DIAGNOSIS — Z20822 Contact with and (suspected) exposure to covid-19: Secondary | ICD-10-CM

## 2018-10-27 LAB — NOVEL CORONAVIRUS, NAA: SARS-CoV-2, NAA: NOT DETECTED

## 2019-10-24 IMAGING — CT CT NECK W/ CM
4 of 5 series · 16 of 33 positions shown, 18 images · IV contrast (omnipaque)
Comparison: None.

CLINICAL DATA: Right ear swelling and pain for 3 days

EXAM:
CT NECK WITH CONTRAST
TECHNIQUE: Multidetector CT imaging of the neck was performed using the
standard protocol following the bolus administration of intravenous
contrast.
CONTRAST:  75mL OMNIPAQUE IOHEXOL 300 MG/ML  SOLN

[Series 6: coronal neck · coronal · 0.42mm/px · 3 of 111 slices shown]
[im 23/111  bone]
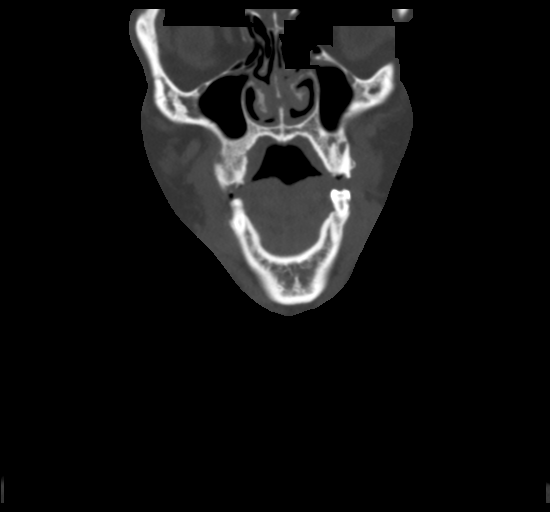
[im 45/111  bone]
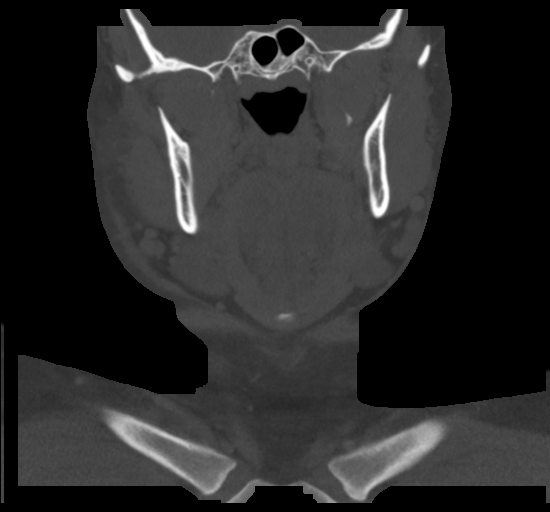
[im 67/111  bone]
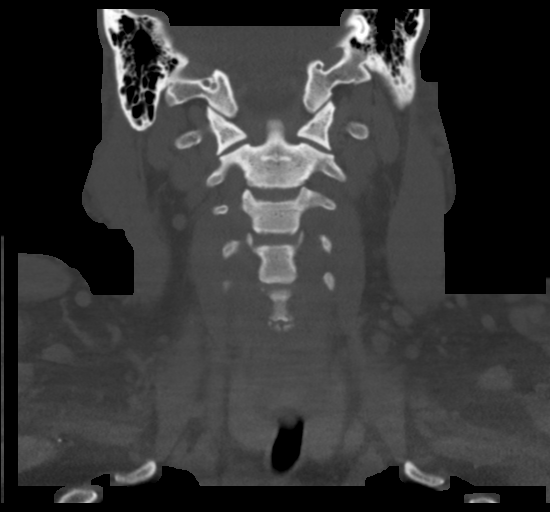

[Series 7: sagittal neck · sagittal · 0.41mm/px · 5 of 106 slices shown, 6 images]
[im 36/106  bone]
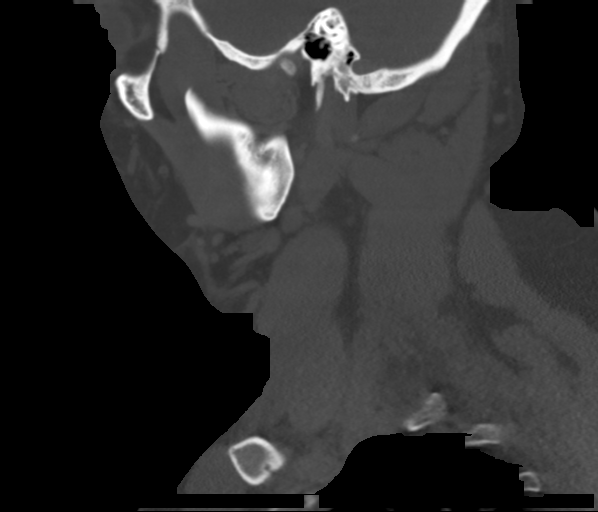
[im 44/106  bone]
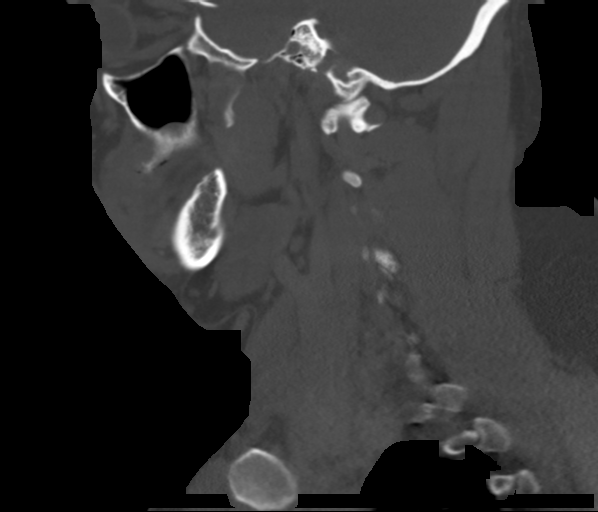
[im 53/106  soft-tissue]
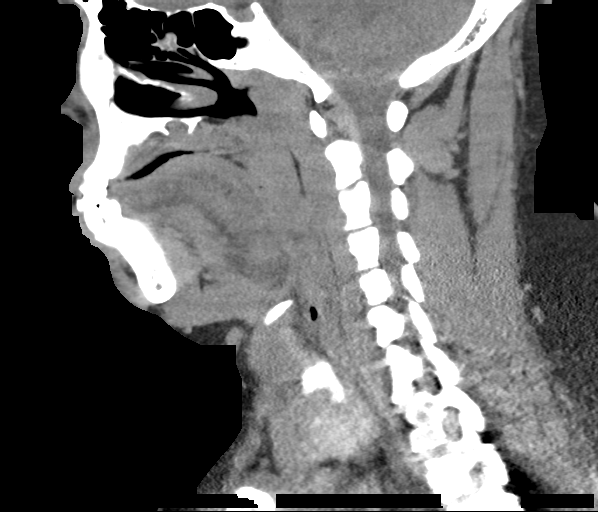
[im 53/106  bone]
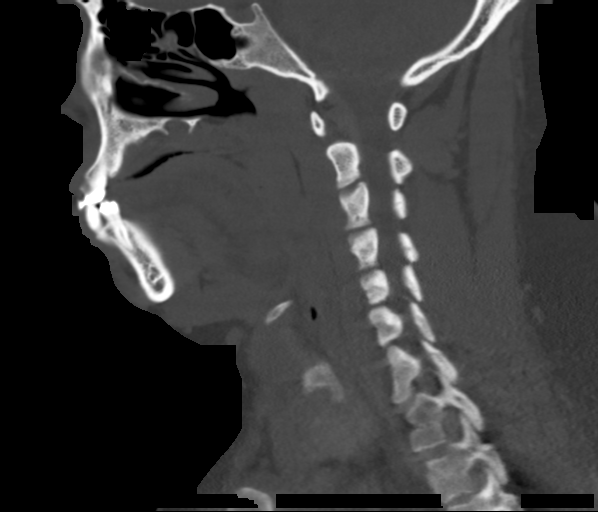
[im 62/106  bone]
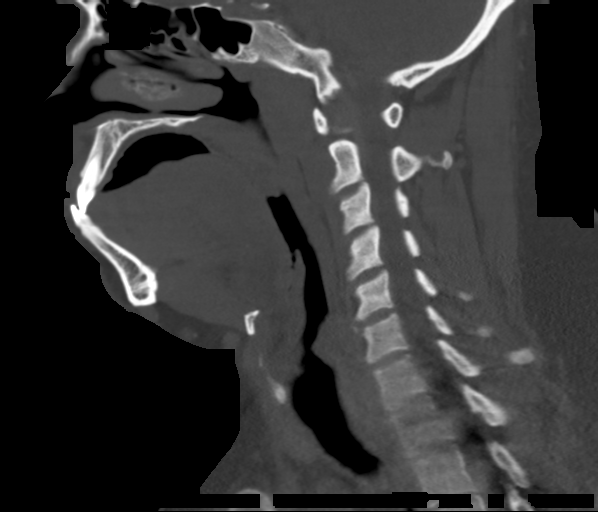
[im 71/106  bone]
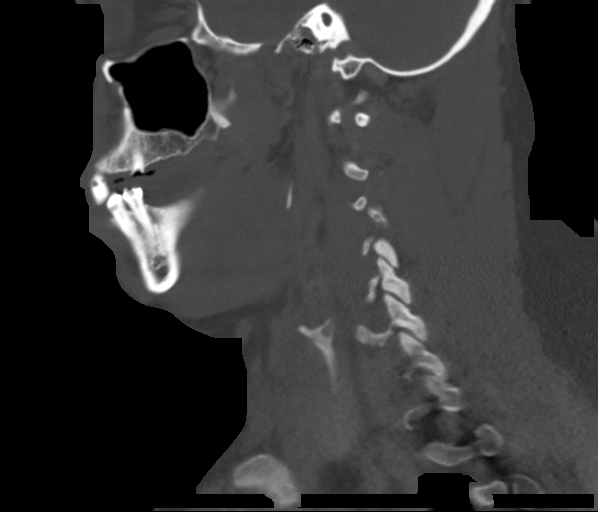

[Series 8: orthogonal ax · axial · 0.43mm/px · z∈[-130,-8]mm · 4 of 103 slices shown, 5 images]
[im 21/103  soft-tissue]
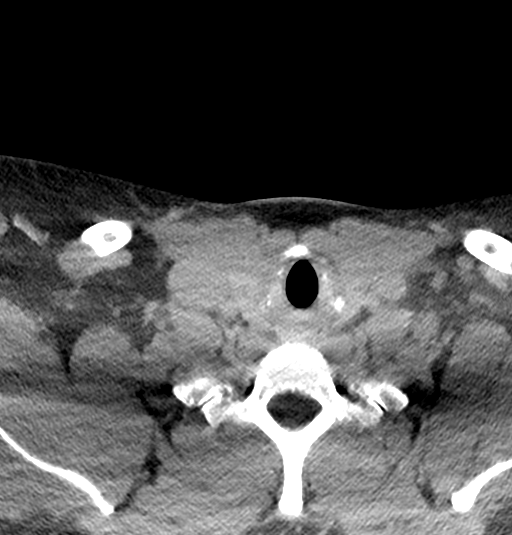
[im 21/103  bone]
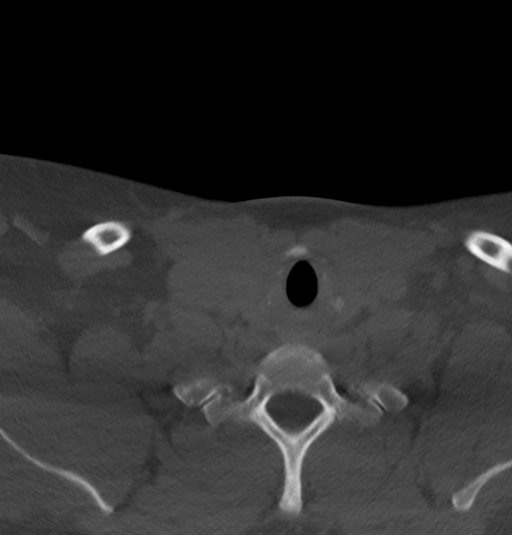
[im 41/103  bone]
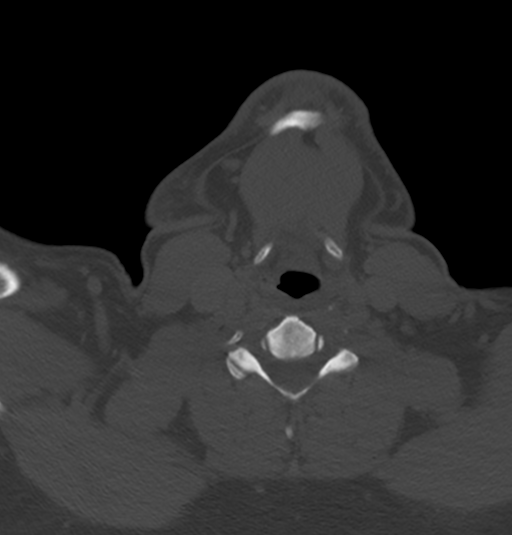
[im 62/103  bone]
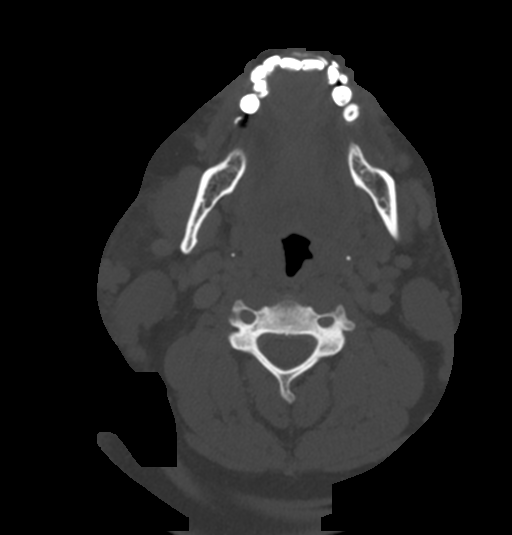
[im 82/103  bone]
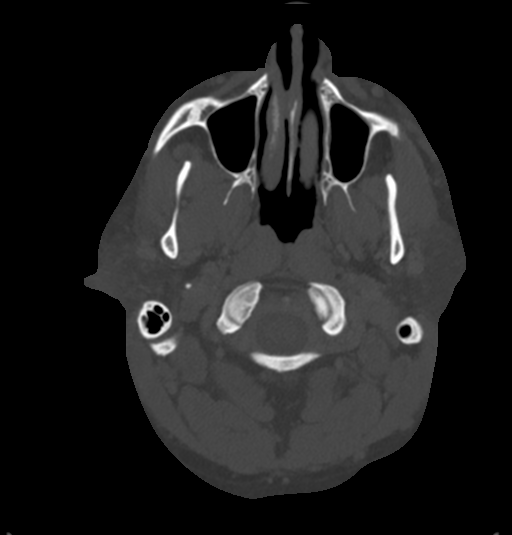

[Series 9: axial neck · axial · 0.55mm/px · z∈[-133,-7]mm · 4 of 105 slices shown]
[im 21/105  bone]
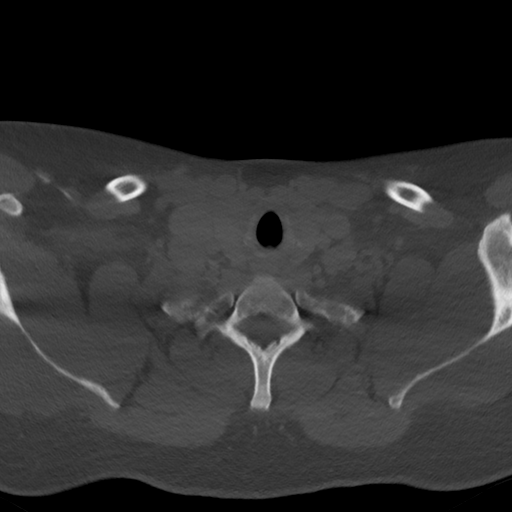
[im 42/105  bone]
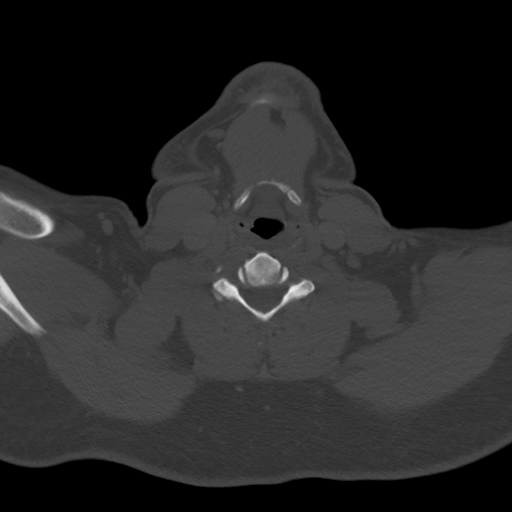
[im 63/105  bone]
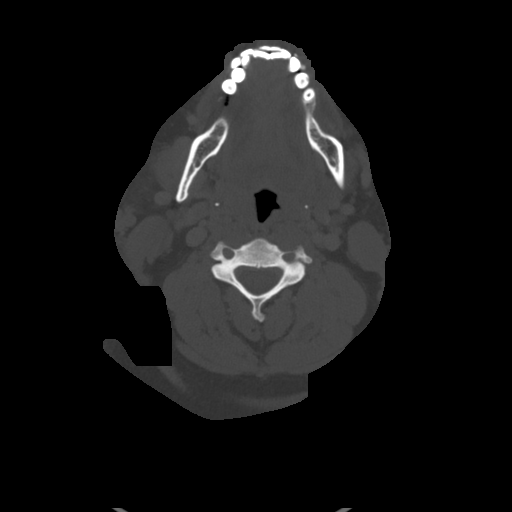
[im 84/105  bone]
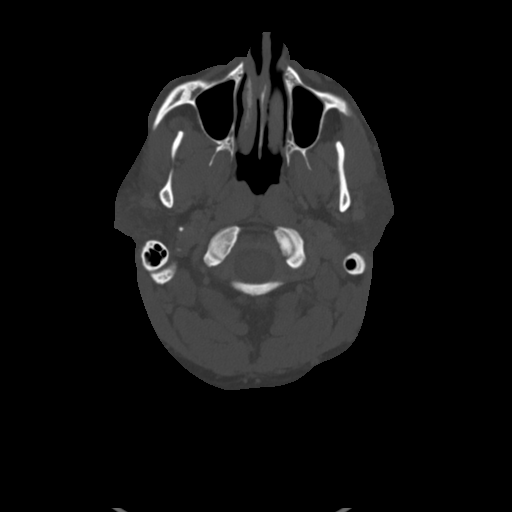

[16 of 33 positions shown; findings below may reference images not displayed]

FINDINGS: Pharynx and larynx: No evidence of mass or swelling

Salivary glands: Homogeneous lobulated nodules in the superficial
right parotid are likely enlarged nodes in this setting. No primary
salivary disease suspected.

Thyroid: Negative

Lymph nodes: Adenopathy in the right parotid bed as noted above.

Vascular: Negative

Limited intracranial: Partially empty sella

Visualized orbits: Negative

Mastoids and visualized paranasal sinuses: Remote blowout fracture
of the medial wall right orbit affecting the right ethmoid sinuses.
No acute sinusitis. The mastoid and middle ear spaces are clear.

Skeleton: Poor dentition with periapical erosions around remaining
bilateral maxillary molars.

Upper chest: Negative

Other: There is low-density thickening of the right ear lobe
measuring 14 mm, consistent with abscess rather than keloid or mass
given the clinical history. The skin is regionally thickened with
subcutaneous stranding.
IMPRESSION: 14 mm abscess in the right ear lobe with regional cellulitis and
reactive adenitis.

## 2022-02-21 ENCOUNTER — Other Ambulatory Visit: Payer: Self-pay

## 2022-02-21 ENCOUNTER — Emergency Department
Admission: EM | Admit: 2022-02-21 | Discharge: 2022-02-21 | Disposition: A | Discharge status: Home / Self Care | Payer: Self-pay | Attending: Student in an Organized Health Care Education/Training Program | Admitting: Student in an Organized Health Care Education/Training Program

## 2022-02-21 DIAGNOSIS — L723 Sebaceous cyst: Secondary | ICD-10-CM | POA: Insufficient documentation

## 2022-02-21 HISTORY — DX: Essential (primary) hypertension: I10

## 2022-02-21 MED ORDER — CEPHALEXIN 500 MG PO CAPS: 500.0000 mg | ORAL_CAPSULE | Freq: Four times a day (QID) | ORAL | 0 refills | Status: AC

## 2022-02-21 NOTE — ED Triage Notes (Signed)
Pt to ED for abscess to right forehead that started 2 months ago. States is hurting.

## 2022-02-21 NOTE — Discharge Instructions (Signed)
Please call dermatology office to schedule follow-up appointment to have sebaceous cyst removed.  Take antibiotic as prescribed and return to the ER for any fevers, redness, increasing pain or swelling.

## 2022-02-21 NOTE — ED Provider Notes (Signed)
[facility] EMERGENCY DEPARTMENT Provider Note   CSN: [id number] Arrival date & time: 02/21/22  1348     History  Chief Complaint  Patient presents with   Abscess    Dylan Schultz is a 41 y.o. male.  Presents to the emergency department for evaluation of cyst along the right side of his face just lateral to his right eye.  Cyst is along the soft tissue and he states he has had it for a couple of months.  10 months ago he had this drained at the emergency department where white thick material was removed consistent with a sebaceous cyst.  Patient states cyst has returned over the last couple months.  He has had a little bit of pain in it over the last couple weeks.  No warmth redness or drainage.  Denies any fevers.  No eye pain, pain with EOM or vision changes  HPI     Home Medications Prior to Admission medications   Medication Sig Start Date End Date Taking? Authorizing Provider  cephALEXin (KEFLEX) 500 MG capsule Take 1 capsule (500 mg total) by mouth 4 (four) times daily for 7 days. 02/21/22 02/28/22 Yes Evon Slack, PA-C  HYDROcodone-acetaminophen (NORCO/VICODIN) 5-325 MG tablet Take 1 tablet by mouth every 6 (six) hours as needed. 11/17/17   Burgess Amor, PA-C  lisinopril-hydrochlorothiazide (ZESTORETIC) 20-12.5 MG tablet Take 1 tablet by mouth daily. 04/28/15   Veryl Speak, FNP  Naproxen-Esomeprazole (VIMOVO) 500-20 MG TBEC Take 1 tablet by mouth 2 (two) times daily as needed. 04/28/15   Veryl Speak, FNP  sulfamethoxazole-trimethoprim (BACTRIM DS,SEPTRA DS) 800-160 MG tablet Take 1 tablet by mouth 2 (two) times daily. 09/04/17   Tommi Rumps, PA-C  triamcinolone (KENALOG) 0.025 % ointment Apply 1 application topically 2 (two) times daily. 04/28/15   Veryl Speak, FNP      Allergies    Patient has no known allergies.    Review of Systems   Review of Systems  Physical Exam Updated Vital Signs BP (!) 181/106   Pulse 77   Temp  98.3 F (36.8 C) (Oral)   Resp 18   Ht 5\' 9"  (1.753 m)   Wt 115.7 kg   SpO2 98%   BMI 37.66 kg/m  Physical Exam Constitutional:      Appearance: He is well-developed.  HENT:     Head: Normocephalic and atraumatic.     Comments: Right temporal region just lateral to the right eye there is a 1.5 cm firm nodule that is without erythema, warmth.  No redness or drainage.  Patient minimally tender to palpation.  Normal EOM, vision.    Nose: Nose normal.  Eyes:     Conjunctiva/sclera: Conjunctivae normal.  Cardiovascular:     Rate and Rhythm: Normal rate.  Pulmonary:     Effort: Pulmonary effort is normal. No respiratory distress.  Musculoskeletal:        General: Normal range of motion.     Cervical back: Normal range of motion.  Skin:    General: Skin is warm.     Findings: No rash.  Neurological:     Mental Status: He is alert and oriented to person, place, and time.  Psychiatric:        Behavior: Behavior normal.        Thought Content: Thought content normal.     ED Results / Procedures / Treatments   Labs (all labs ordered are listed, but only abnormal results are displayed)  Labs Reviewed - No data to display  EKG None  Radiology No results found.  Procedures Procedures    Medications Ordered in ED Medications - No data to display  ED Course/ Medical Decision Making/ A&P                           Medical Decision Making Risk Prescription drug management.   41 year old male with right soft tissue mass consistent with a sebaceous cyst.  Recommend patient call PCP for dermatology referral or call dermatology to schedule follow-up appointment.  Patient has noticed a slight amount of pain in the cyst over the last couple of weeks, this has been present for at least 2 months.  Same cyst was drained in the emergency department back in February of this year, notes describe sebaceous cyst.  Patient placed on an antibiotic today, patient suspects it could be a  possibility could be starting to become infected due to slight increase in pain.  No no signs of redness, warmth or drainage today Final Clinical Impression(s) / ED Diagnoses Final diagnoses:  Sebaceous cyst face    Rx / DC Orders ED Discharge Orders          Ordered    cephALEXin (KEFLEX) 500 MG capsule  4 times daily        02/21/22 1700              Ronnette Juniper 02/21/22 1705    Willy Eddy, MD 02/21/22 1720

## 2022-04-26 DIAGNOSIS — D485 Neoplasm of uncertain behavior of skin: Secondary | ICD-10-CM | POA: Diagnosis not present

## 2022-04-26 DIAGNOSIS — L720 Epidermal cyst: Secondary | ICD-10-CM | POA: Diagnosis not present

## 2022-04-26 DIAGNOSIS — R0789 Other chest pain: Secondary | ICD-10-CM | POA: Diagnosis not present

## 2022-05-02 DIAGNOSIS — F17200 Nicotine dependence, unspecified, uncomplicated: Secondary | ICD-10-CM | POA: Diagnosis not present

## 2022-05-02 DIAGNOSIS — I10 Essential (primary) hypertension: Secondary | ICD-10-CM | POA: Diagnosis not present

## 2022-05-02 DIAGNOSIS — Z4802 Encounter for removal of sutures: Secondary | ICD-10-CM | POA: Diagnosis not present

## 2022-05-12 LAB — HM HEPATITIS C SCREENING LAB: HM Hepatitis Screen: NEGATIVE

## 2022-05-16 DIAGNOSIS — L732 Hidradenitis suppurativa: Secondary | ICD-10-CM | POA: Insufficient documentation

## 2023-07-17 ENCOUNTER — Ambulatory Visit: Admitting: General Practice

## 2023-07-17 DIAGNOSIS — Z7689 Persons encountering health services in other specified circumstances: Secondary | ICD-10-CM

## 2023-07-17 NOTE — Progress Notes (Deleted)
   New Patient Office Visit  Subjective    Patient ID: Dylan Schultz, male    DOB: January 19, 1981  Age: 43 y.o. MRN: [id number]  CC: No chief complaint on file.   HPI Dylan Schultz is a 43 y.o. male presents to establish care.   Previous PCP/physical/labs:   HTN:    ***  Outpatient Encounter Medications as of 07/17/2023  Medication Sig   HYDROcodone -acetaminophen  (NORCO/VICODIN) 5-325 MG tablet Take 1 tablet by mouth every 6 (six) hours as needed.   lisinopril -hydrochlorothiazide  (ZESTORETIC ) 20-12.5 MG tablet Take 1 tablet by mouth daily.   Naproxen -Esomeprazole  (VIMOVO) 500-20 MG TBEC Take 1 tablet by mouth 2 (two) times daily as needed.   sulfamethoxazole -trimethoprim  (BACTRIM  DS,SEPTRA  DS) 800-160 MG tablet Take 1 tablet by mouth 2 (two) times daily.   triamcinolone  (KENALOG ) 0.025 % ointment Apply 1 application topically 2 (two) times daily.   No facility-administered encounter medications on file as of 07/17/2023.    Past Medical History:  Diagnosis Date   Hypertension     Past Surgical History:  Procedure Laterality Date   DENTAL SURGERY      Family History  Problem Relation Age of Onset   Thyroid disease Mother    Hypertension Father     Social History   Socioeconomic History   Marital status: Single    Spouse name: Not on file   Number of children: 0   Years of education: 71   Highest education level: Not on file  Occupational History   Occupation: Holiday representative  Tobacco Use   Smoking status: Every Day    Current packs/day: 0.50    Average packs/day: 0.5 packs/day for 8.0 years (4.0 ttl pk-yrs)    Types: Cigarettes   Smokeless tobacco: Never  Substance and Sexual Activity   Alcohol use: Yes    Alcohol/week: 0.0 standard drinks of alcohol    Comment: occ   Drug use: No   Sexual activity: Not on file  Other Topics Concern   Not on file  Social History Narrative   ** Merged History Encounter **       Fun: Be out and about; stay  active Denies religious beliefs effecting health care.    Social Drivers of Corporate investment banker Strain: Not on file  Food Insecurity: Not on file  Transportation Needs: Not on file  Physical Activity: Not on file  Stress: Not on file  Social Connections: Not on file  Intimate Partner Violence: Not on file    ROS      Objective    There were no vitals taken for this visit.  Physical Exam  {Labs (Optional):23779}    Assessment & Plan:  Establishing care with new doctor, encounter for     No follow-ups on file.   Jolanda Nation, NP

## 2023-10-09 ENCOUNTER — Ambulatory Visit (LOCAL_COMMUNITY_HEALTH_CENTER)

## 2023-10-09 DIAGNOSIS — Z111 Encounter for screening for respiratory tuberculosis: Secondary | ICD-10-CM

## 2023-10-12 ENCOUNTER — Ambulatory Visit (LOCAL_COMMUNITY_HEALTH_CENTER)

## 2023-10-12 DIAGNOSIS — Z111 Encounter for screening for respiratory tuberculosis: Secondary | ICD-10-CM

## 2023-10-12 LAB — TB SKIN TEST
Induration: 0 mm
TB Skin Test: NEGATIVE
